# Patient Record
Sex: Female | Born: 1985 | Race: White | Hispanic: No | Marital: Single | Smoking: Current every day smoker
Health system: Southern US, Community
[De-identification: ages and names within clinical notes are randomized; demographics above are authoritative.]

## PROBLEM LIST (undated history)

## (undated) DIAGNOSIS — F1110 Opioid abuse, uncomplicated: Secondary | ICD-10-CM

## (undated) DIAGNOSIS — F1910 Other psychoactive substance abuse, uncomplicated: Secondary | ICD-10-CM

## (undated) DIAGNOSIS — J939 Pneumothorax, unspecified: Secondary | ICD-10-CM

## (undated) HISTORY — PX: OTHER SURGICAL HISTORY: SHX169

---

## 2014-12-31 ENCOUNTER — Emergency Department (HOSPITAL_COMMUNITY)
Admission: EM | Admit: 2014-12-31 | Discharge: 2014-12-31 | Disposition: A | Discharge status: Home / Self Care | Payer: Self-pay | Attending: Emergency Medicine | Admitting: Emergency Medicine

## 2014-12-31 ENCOUNTER — Encounter (HOSPITAL_COMMUNITY): Payer: Self-pay

## 2014-12-31 DIAGNOSIS — Z720 Tobacco use: Secondary | ICD-10-CM | POA: Insufficient documentation

## 2014-12-31 DIAGNOSIS — F1120 Opioid dependence, uncomplicated: Secondary | ICD-10-CM | POA: Insufficient documentation

## 2014-12-31 DIAGNOSIS — F1410 Cocaine abuse, uncomplicated: Secondary | ICD-10-CM | POA: Insufficient documentation

## 2014-12-31 HISTORY — DX: Other psychoactive substance abuse, uncomplicated: F19.10

## 2014-12-31 LAB — CBC
HEMATOCRIT: 38.5 % (ref 36.0–46.0)
Hemoglobin: 12.4 g/dL (ref 12.0–15.0)
MCH: 28.5 pg (ref 26.0–34.0)
MCHC: 32.2 g/dL (ref 30.0–36.0)
MCV: 88.5 fL (ref 78.0–100.0)
Platelets: 237 10*3/uL (ref 150–400)
RBC: 4.35 MIL/uL (ref 3.87–5.11)
RDW: 14.3 % (ref 11.5–15.5)
WBC: 4.8 10*3/uL (ref 4.0–10.5)

## 2014-12-31 LAB — COMPREHENSIVE METABOLIC PANEL
ALBUMIN: 3.5 g/dL (ref 3.5–5.0)
ALT: 68 U/L — AB (ref 14–54)
AST: 73 U/L — AB (ref 15–41)
Alkaline Phosphatase: 102 U/L (ref 38–126)
Anion gap: 7 (ref 5–15)
BILIRUBIN TOTAL: 0.7 mg/dL (ref 0.3–1.2)
BUN: 8 mg/dL (ref 6–20)
CHLORIDE: 109 mmol/L (ref 101–111)
CO2: 27 mmol/L (ref 22–32)
CREATININE: 0.74 mg/dL (ref 0.44–1.00)
Calcium: 9.1 mg/dL (ref 8.9–10.3)
GFR calc Af Amer: >60 mL/min (ref >60)
GLUCOSE: 97 mg/dL (ref 65–99)
Potassium: 4.2 mmol/L (ref 3.5–5.1)
Sodium: 143 mmol/L (ref 135–145)
Total Protein: 6.9 g/dL (ref 6.5–8.1)

## 2014-12-31 LAB — RAPID URINE DRUG SCREEN, HOSP PERFORMED
Amphetamines: NOT DETECTED
BARBITURATES: NOT DETECTED
Benzodiazepines: NOT DETECTED
COCAINE: POSITIVE — AB
Opiates: POSITIVE — AB
TETRAHYDROCANNABINOL: NOT DETECTED

## 2014-12-31 LAB — ETHANOL

## 2014-12-31 NOTE — Discharge Instructions (Signed)
Drug Abuse and Addiction in Sports °There are many types of drugs that one may become addicted to including illegal drugs (marijuana, cocaine, amphetamines, hallucinogens, and narcotics), prescription drugs (hydrocodone, codeine, and alprazolam), and other chemicals such as alcohol or nicotine. °Two types of addiction exist: physical and emotional. Physical addiction usually occurs after prolonged use of a drug. However, some drugs may only take a couple uses before addiction can occur. Physical addiction is marked by withdrawal symptoms in which the person experiences negative symptoms such as sweat, anxiety, tremors, hallucinations, or cravings in the absence of using the drug. Emotional dependence is the psychological desire for the "high" that the drugs produce when taken. °SYMPTOMS  °· Inattentiveness. °· Negligence. °· Forgetfulness. °· Insomnia. °· Mood swings. °RISK INCREASES WITH:  °· Family history of addiction. °· Personal history of addictive personality. °Studies have shown that risk takers, which many athletes are, have a higher risk of addiction. °PREVENTION °The only adequate prevention of drug abuse is abstinence from drugs. °TREATMENT  °The first step in quitting substance abuse is recognizing the problem and realizing that one has the power to change. Quitting requires a plan and support from others. It is often necessary to seek medical assistance. Caregivers are available to offer counseling, and for certain cases, medicine to diminish the physical symptoms of withdrawal. Many organizations exist such as Alcoholics Anonymous, Narcotics Anonymous, or the National Council on Alcoholism that offer support for individuals who have chosen to quit their habits. °Document Released: 04/20/2005 Document Revised: 09/04/2013 Document Reviewed: 08/02/2008 °ExitCare® Patient Information ©2015 [employer]. This information is not intended to replace advice given to you by your health care provider. Make  sure you discuss any questions you have with your health care provider. ° ° °Emergency Department Resource Guide °1) Find a Doctor and Pay Out of Pocket °Although you won't have to find out who is covered by your insurance plan, it is a good idea to ask around and get recommendations. You will then need to call the office and see if the doctor you have chosen will accept you as a new patient and what types of options they offer for patients who are self-pay. Some doctors offer discounts or will set up payment plans for their patients who do not have insurance, but you will need to ask so you aren't surprised when you get to your appointment. ° °2) Contact Your Local Health Department °Not all health departments have doctors that can see patients for sick visits, but many do, so it is worth a call to see if yours does. If you don't know where your local health department is, you can check in your phone book. The CDC also has a tool to help you locate your state's health department, and many state websites also have listings of all of their local health departments. ° °3) Find a Walk-in Clinic °If your illness is not likely to be very severe or complicated, you may want to try a walk in clinic. These are popping up all over the country in pharmacies, drugstores, and shopping centers. They're usually staffed by nurse practitioners or physician assistants that have been trained to treat common illnesses and complaints. They're usually fairly quick and inexpensive. However, if you have serious medical issues or chronic medical problems, these are probably not your best option. ° °No Primary Care Doctor: °- Call [facility] at  832-8000 - they can help you locate a primary care doctor that  accepts your insurance, provides   certain services, etc. °- Physician Referral Service- 1-800-533-3463 ° °Chronic Pain Problems: °Organization         Address  Phone   Notes  °[facility]  (336) 297-2271 Patients  need to be referred by their primary care doctor.  ° °Medication Assistance: °Organization         Address  Phone   Notes  °[place] [facility] [address] °[place], [place] [address] (336) 641-8030 --Must be a resident of [place] °-- Must have NO insurance coverage whatsoever (no Medicaid/ Medicare, etc.) °-- The pt. MUST have a primary care doctor that directs their care regularly and follows them in the community °  °[facility]  (866) 331-1348   °[employer]  (888) 892-1162   ° °Agencies that provide inexpensive medical care: °Organization         Address  Phone   Notes  °[facility]  (336) 832-8035   °[facility]    (336) 832-7272   °[facility] [address] °[place], [place] [address] (336) 832-4777   °[facility] [address], °[place] (336) 271-4999   °[facility]    (336) 373-0678   °[facility]    (336) 272-1050   °[facility] ° [address], [place] Phone:  (336) 832-4444, Fax:  (336) 832-4440 Hours of Operation:  9 am - 6 pm, M-F.  Also accepts Medicaid/Medicare and self-pay.  °[facility] ° [address], [place] Phone: (336) 832-3150, Fax: (336) 832-3151. Hours of Operation:  8:30 am - 5:30 pm, M-F.  Also accepts Medicaid and self-pay.  °[facility] [place] [address], [place] Phone: (336) 878-6027   °[facility] [address], [place], [place] (336)723-1848, Ext. 123 Mondays & Thursdays: 7-9 AM.  First 15 patients are seen on a first come, first serve basis. °  ° °Medicaid-accepting [place] Providers: ° °Organization         Address  Phone   Notes  °[facility] [address], [place] (336) 641-2100 Also accepts self-pay patients.  °[facility] [address], [place] ° (336) 856-9996     °[facility] [address], [place] (336) 288-8857   °[facility] [address] [place] [address], [place] (336) 299-7000   °Veita Bland [address], [place]  ° (336) 373-1557 Only accepts [place] Access Medicaid patients after they have their name applied to their card.  ° °Self-Pay (no insurance) in [place]: ° °Organization         Address  Phone   Notes  °Sickle Cell Patients, [facility] [address], [place] (336) 832-1970   °[facility] [address], [place] (336) 832-4400   ° [facility] ° [address] [place] [address], [place] (336) 992-4800   °[facility]/Dr. Osei-Bonsu ° [address] [place] [address], [place] or [address], [place] (336) 841-8500 Phone number for both [place] and [place] locations is the same.  °[facility] [address], [place] (336) 299-0000   °[facility] [address] [place] [address], [place] or [address] (336) 852-7530 °(336) 878-2260   °[facility] [address], [place] (336) 350-1642, phone; (336) 294-5005, fax Sees patients 1st and 3rd Saturday of every month.  Must not qualify for public or private insurance (i.e. Medicaid, Medicare, [place] Health Choice, Veterans' Benefits) • Household income should be no more than 200% of the poverty level •The clinic cannot treat you if you are pregnant or think you are pregnant • Sexually transmitted diseases are not treated at the clinic.  ° ° °Dental Care: °Organization         Address  Phone  Notes  °[facility] [address], [place] (336) 641-6152 Accepts children up to age 21 who are enrolled in Medicaid or [place] Health Choice; pregnant women with a Medicaid card; and children who have applied for Medicaid or [place] Health Choice, but were declined,  whose parents can pay a reduced fee at time of service.  °[facility] [place]  [address], [place] (336) 641-7733 Accepts children up to age 21 who are enrolled in Medicaid or [place] Health Choice; pregnant women with a Medicaid card; and children who have applied for Medicaid or [place] Health Choice, but were declined, whose parents can pay a reduced fee at time of service.  °[facility] ° [address], [place] (336) 641-4533 Patients are seen by appointment only. Walk-ins are not accepted. [facility] will see patients 18 years of age and older. °Monday - Tuesday (8am-5pm) °Most Wednesdays (8:30-5pm) °$30 per visit, cash only  °[facility] ° [address], [place] (336) 641-4533 Patients are seen by appointment only. Walk-ins are not accepted. [facility] will see patients 18 years of age and older. °One Wednesday Evening (Monthly: Volunteer Based).  $30 per visit, cash only  °[facility]  (919) 537-3737 for adults; Children under age 4, call Graduate Pediatric Dentistry at (919) [phone]. Children aged 4-14, please call (919) [phone] to request a pediatric application. ° Dental services are provided in all areas of dental care including fillings, crowns and bridges, complete and partial dentures, implants, gum treatment, root canals, and extractions. Preventive care is also provided. Treatment is provided to both adults and children. °Patients are selected via a lottery and there is often a waiting list. °  °[facility] [address], °[place] ° (336) 763-8833 [email or web address] °  °[facility] [address], [place], [place] (336)723-1848, Ext. 123 Second and Fourth Thursday of each month, opens at 6:30 AM; Clinic ends at 9 AM.  Patients are seen on a first-come first-served basis, and a limited number are seen during each clinic.  ° °[facility] ° [address], [place], [place] (336) 723-7904   Eligibility Requirements °You must have lived in [place], [place], or [place] counties for at least the last three months. °  You cannot be eligible for state or federal sponsored healthcare insurance, including Veterans Administration, Medicaid, or Medicare. °  You generally cannot be eligible for healthcare insurance through your employer.  °  How to apply: °Eligibility screenings are held every Tuesday and Wednesday afternoon from 1:00 pm until 4:00 pm. You do not need an appointment for the interview!  °[facility] [address], [place], [place] 336-631-2330   °[facility]  336-342-8273   °[facility]  336-703-3100   °[facility]  336-570-6415   ° °  Behavioral Health Resources in the Community: °Intensive Outpatient Programs °Organization         Address  Phone  Notes  °[facility] [address], [place], [place] 336-878-6098   °[facility] [address], [place], [place] 336-832-9800   °[facility] [address], [place], [place] ° 336-882-2125   °[facility] [address],  °[place], [place] 1-800-853-5163 or 336-641-4981   °Substance Abuse Resources °Organization         Address  Phone  Notes  °[facility]  336-882-2125   °[facility]  336-784-9470   °[facility]  336-285-9073   °[facility]  336-845-3988   °Residential & Outpatient Substance Abuse Program  1-800-659-3381   °Psychological Services °Organization         Address  Phone  Notes  °[facility]  336- 832-9600   °[facility]  336- 378-7881   °[facility] [address], [place] 1-800-853-5163 or 336-641-4981   ° °Mobile Crisis Teams °Organization         Address  Phone  Notes  °[facility], Mobile Crisis Care Unit   1-877-626-1772   °Assertive °[facility] ° [address] [place], [place] 336-834-9664   °Sharon DeEsch [address] °[place] 336-554-5454   ° °Self-Help/Support Groups °Organization         Address  Phone             Notes  °[facility] [place] - variety of support groups  336- 373-1402 Call for more information  °Narcotics Anonymous (NA), [facility] [address], °[place]  2 meetings at this location  ° °Residential Treatment Programs °Organization         Address  Phone  Notes  °[facility] [address],    °[place]  1-866-801-8205   °[facility] ° [address], [place], [place] 704-293-8524   °[facility] [address], [place] 336-845-3988 Admissions: 8am-3pm M-F  °[facility] [address]    °[place], [place] 336-841-1104   °[facility] [address], [place], [place] 336-379-7146   °[facility] [address]  °[place], [place] 336-285-9073   °[facility] - Intensive Outpatient [address], [place], [place] 336-852-3033   °[facility] [address]  °[place], [place] 1-877-615-2722 or 336-784-9470   °[facility] [address] [place], [place] 336-227-7417 Accepts Medicaid  °[facility] [address]  °[place] 1-800-659-3381 Substance Abuse/Addiction Treatment  ° °[place] Behavioral Health Resources °Organization         Address  Phone  Notes  °[facility]  (888) 581-9988   °Julie Brannon, PhD [address] [place], [place]   (336) 349-5553 or (336) 951-0000   ° Behavioral   [address] °[place], [place] (336) 349-4454   °[facility] [address], [place], [place] (336) 342-8316 Insurance/Medicaid/sponsorship through [facility]  °[facility] [address]                                     [place], [place] (336) 342-8316 Therapy/tele-psych/case  °[facility] [address]  °   [place], [place] (336) 349-2233    °Dr. Arfeen  (336) 349-4544   °[facility] [place]  [employer] [place] [facility] 1) [address], [place] °2) [address], [place] °3)  [address], [place] (336) 349-3220 °(336) 342-7768 ° °(336) 342-8140   °[place] [facility] (336) 342-1394 or (336) 342-3537 (After Hours)    ° ° ° °

## 2014-12-31 NOTE — ED Notes (Signed)
Patient states she was told to come to the ED to be medically cleared so she could be admitted in a substance  Abuse program. Patient denies any SI/HI, visual or auditory hallucinations.

## 2014-12-31 NOTE — ED Provider Notes (Signed)
CSN: [id number]     Arrival date & time 12/31/14  1737 History   First MD Initiated Contact with Patient 12/31/14 1959     Chief Complaint  Patient presents with  . Medical Clearance  . Addiction Problem    heroin and cocaine     (Consider location/radiation/quality/duration/timing/severity/associated sxs/prior Treatment) The history is provided by the patient. No language interpreter was used.  Ms. Cheyenne Johns is a 29 y/o female with a history of substance abuse who presents for admission into outpatient substance abuse program ([facility]). She states that they would not admit her because she admitted to them that she was suicidal when coming off of heroin but that this happens to her often. She denies any SI or any plan now. She states that the program requested that she be medically cleared and that a form is faxed to the program stating that she has not longer suicidal. She denies any HI, hallucinations, alcohol use, benzodiazepine use. She does admit to using cocaine with heroin on occasion. She denies any complaints of pain now.  Past Medical History  Diagnosis Date  . Substance abuse    Past Surgical History  Procedure Laterality Date  . Nerve replacement     Family History  Problem Relation Age of Onset  . Cancer Mother   . Pneumonia Mother   . Seizures Father   . Alcohol abuse Father    Social History  Substance Use Topics  . Smoking status: Current Every Day Smoker -- 1.00 packs/day    Types: Cigarettes  . Smokeless tobacco: Never Used  . Alcohol Use: Yes     Comment: occasionaloly   OB History    No data available     Review of Systems  Respiratory: Negative for shortness of breath.   Cardiovascular: Negative for chest pain.  Psychiatric/Behavioral: Negative for suicidal ideas, hallucinations and agitation.  All other systems reviewed and are negative.     Allergies  Review of patient's allergies indicates no known allergies.  Home Medications   Prior to  Admission medications   Medication Sig Start Date End Date Taking? Authorizing Provider  ibuprofen (ADVIL,MOTRIN) 200 MG tablet Take 1,000 mg by mouth once as needed for headache.   Yes Historical Provider, MD   BP 112/64 mmHg  Pulse 74  Temp(Src) 97.6 F (36.4 C) (Oral)  Resp 18  SpO2 99%  LMP 09/30/2014 Physical Exam  Constitutional: She is oriented to person, place, and time. She appears well-developed and well-nourished.  HENT:  Head: Normocephalic and atraumatic.  Eyes: Conjunctivae are normal.  Neck: Normal range of motion. Neck supple.  Cardiovascular: Normal rate, regular rhythm and normal heart sounds.   Pulmonary/Chest: Effort normal and breath sounds normal.  Abdominal: Soft. There is no tenderness.  Musculoskeletal: Normal range of motion.  Neurological: She is alert and oriented to person, place, and time.  Skin: Skin is warm and dry.  Psychiatric: She has a normal mood and affect. Her behavior is normal. Her mood appears not anxious. Her speech is not rapid and/or pressured. She expresses no homicidal and no suicidal ideation. She expresses no suicidal plans and no homicidal plans.  Nursing note and vitals reviewed.   ED Course  Procedures (including critical care time) Labs Review Labs Reviewed  COMPREHENSIVE METABOLIC PANEL - Abnormal; Notable for the following:    AST 73 (*)    ALT 68 (*)    All other components within normal limits  URINE RAPID DRUG SCREEN, HOSP PERFORMED - Abnormal;  Notable for the following:    Opiates POSITIVE (*)    Cocaine POSITIVE (*)    All other components within normal limits  ETHANOL  CBC    Imaging Review No results found. I have personally reviewed and evaluated these images and lab results as part of my medical decision-making.   EKG Interpretation None      MDM   Final diagnoses:  Heroin dependence   Patient verbalizes no SI, HI, hallucinations, benzodiazepine or alcohol use. She states she just needs medical  clearance to be admitted into an outpatient heroin detox program. She is well-appearing and in no acute distress. I do not believe she is a harm to herself or others. Social work has seen the patient and agrees with the plan. They called the outpatient clinic, [facility], and stated that they would have a bed for her tomorrow. Her labs are unremarkable and I believe she is safe for discharge.     Catha Gosselin, PA-C 01/01/15 0012  Azalia Bilis, MD 01/01/15 [phone]

## 2014-12-31 NOTE — Progress Notes (Signed)
CSW was consulted by PA to speak with patient.  CSW met with patient at bedside. Patient denies SI, HI and AVH. Patient informed CSW that she would like to go to [facility] for rehab from heroin. Patient informed CSW that she feels safe to return home. Patient states that she plans to check into [facility] in the morning.  CSW reached out to [facility] and spoke with Nurse Raquel Sarna, who states she needs progress notes on the patient and her mental status. Nurse states that they do have bed availability at this time.  CSW will fax [facility] Nurse the requested information.  [facility] Fax [phone]  Mana/Patient [phone] Cheyenne Johns [phone] ED CSW 12/31/2014 9:06 PM

## 2014-12-31 NOTE — ED Notes (Signed)
Called pt x2, no answer from the lobby.  

## 2016-01-23 ENCOUNTER — Encounter (HOSPITAL_COMMUNITY): Payer: Self-pay | Admitting: Emergency Medicine

## 2016-01-23 ENCOUNTER — Emergency Department (HOSPITAL_COMMUNITY)
Admission: EM | Admit: 2016-01-23 | Discharge: 2016-01-23 | Disposition: A | Discharge status: Home / Self Care | Payer: Self-pay | Attending: Emergency Medicine | Admitting: Emergency Medicine

## 2016-01-23 ENCOUNTER — Emergency Department (HOSPITAL_COMMUNITY): Payer: Self-pay

## 2016-01-23 DIAGNOSIS — J939 Pneumothorax, unspecified: Secondary | ICD-10-CM | POA: Insufficient documentation

## 2016-01-23 DIAGNOSIS — Y929 Unspecified place or not applicable: Secondary | ICD-10-CM | POA: Insufficient documentation

## 2016-01-23 DIAGNOSIS — Y939 Activity, unspecified: Secondary | ICD-10-CM | POA: Insufficient documentation

## 2016-01-23 DIAGNOSIS — Y999 Unspecified external cause status: Secondary | ICD-10-CM | POA: Insufficient documentation

## 2016-01-23 DIAGNOSIS — F17210 Nicotine dependence, cigarettes, uncomplicated: Secondary | ICD-10-CM | POA: Insufficient documentation

## 2016-01-23 HISTORY — DX: Opioid abuse, uncomplicated: F11.10

## 2016-01-23 MED ORDER — KETOROLAC TROMETHAMINE 30 MG/ML IJ SOLN
30.0000 mg | Freq: Once | INTRAMUSCULAR | Status: AC
  Administered 2016-01-23: 30 mg via INTRAMUSCULAR
  Filled 2016-01-23: qty 1

## 2016-01-23 MED ORDER — OXYCODONE-ACETAMINOPHEN 5-325 MG PO TABS
1.0000 | ORAL_TABLET | Freq: Once | ORAL | Status: AC
  Administered 2016-01-23: 1 via ORAL
  Filled 2016-01-23: qty 1

## 2016-01-23 NOTE — Discharge Instructions (Signed)
Please return to the emergency room tomorrow afternoon for repeat chest x-ray. If you experience any new or worsening signs or symptoms in the meantime please return immediately for further evaluation. Use incentive spirometer as directed, Tylenol or ibuprofen as needed for discomfort.

## 2016-01-23 NOTE — ED Triage Notes (Signed)
Pt not answering when her name is called to be triage
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
## 2016-01-24 ENCOUNTER — Emergency Department (HOSPITAL_COMMUNITY): Payer: Self-pay

## 2016-01-24 ENCOUNTER — Encounter (HOSPITAL_COMMUNITY): Payer: Self-pay | Admitting: Emergency Medicine

## 2016-01-24 ENCOUNTER — Emergency Department (HOSPITAL_COMMUNITY)
Admission: EM | Admit: 2016-01-24 | Discharge: 2016-01-24 | Disposition: A | Discharge status: Home / Self Care | Payer: Self-pay | Attending: Emergency Medicine | Admitting: Emergency Medicine

## 2016-01-24 DIAGNOSIS — F17210 Nicotine dependence, cigarettes, uncomplicated: Secondary | ICD-10-CM | POA: Insufficient documentation

## 2016-01-24 DIAGNOSIS — S270XXD Traumatic pneumothorax, subsequent encounter: Secondary | ICD-10-CM | POA: Insufficient documentation

## 2016-01-24 HISTORY — DX: Pneumothorax, unspecified: J93.9

## 2016-01-24 NOTE — ED Triage Notes (Signed)
Pt seen here yesterday dx with pneumothorax. Pt told to return to ED today for repeat CXR and UA. Pt denies worsening symptoms. VSS.
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
## 2016-05-20 ENCOUNTER — Emergency Department (HOSPITAL_COMMUNITY)
Admission: EM | Admit: 2016-05-20 | Discharge: 2016-05-20 | Disposition: A | Discharge status: Home / Self Care | Payer: Self-pay | Attending: Emergency Medicine | Admitting: Emergency Medicine

## 2016-05-20 ENCOUNTER — Encounter (HOSPITAL_COMMUNITY): Payer: Self-pay | Admitting: *Deleted

## 2016-05-20 DIAGNOSIS — F17210 Nicotine dependence, cigarettes, uncomplicated: Secondary | ICD-10-CM | POA: Insufficient documentation

## 2016-05-20 DIAGNOSIS — L0201 Cutaneous abscess of face: Secondary | ICD-10-CM | POA: Insufficient documentation

## 2016-05-20 DIAGNOSIS — L0291 Cutaneous abscess, unspecified: Secondary | ICD-10-CM

## 2016-05-20 DIAGNOSIS — L02212 Cutaneous abscess of back [any part, except buttock]: Secondary | ICD-10-CM | POA: Insufficient documentation

## 2016-05-20 MED ORDER — DOXYCYCLINE HYCLATE 100 MG PO CAPS: 100.0000 mg | ORAL_CAPSULE | Freq: Two times a day (BID) | ORAL | 0 refills | Status: AC

## 2016-05-20 MED ORDER — LIDOCAINE HCL (PF) 1 % IJ SOLN
5.0000 mL | Freq: Once | INTRAMUSCULAR | Status: AC
  Administered 2016-05-20: 5 mL
  Filled 2016-05-20: qty 5

## 2016-05-20 NOTE — ED Triage Notes (Signed)
Pt with abscess to R jaw and back x 4 days.

## 2016-05-20 NOTE — Discharge Instructions (Signed)
Please follow up with your doctor in 3-4 days for a wound recheck. Please take all of your antibiotics as prescribed, do not save or share them. Continue taking Tylenol and Motrin for discomfort. Return sooner for any new or worsening symptoms as we discussed, including, but not limited to fevers, chills or worsening redness or pain.

## 2016-05-20 NOTE — ED Provider Notes (Signed)
[facility] Provider Note   CSN: [id number] Arrival date & time: 05/20/16  1042  By signing my name below, I, Majel HomerPeyton Lee, attest that this documentation has been prepared under the direction and in the presence of LandAmerica FinancialBen Oneal Schoenberger, PA-C . Electronically Signed: Majel HomerPeyton Lee, Scribe. 05/20/2016. 11:26 AM.  History   Chief Complaint Chief Complaint  Patient presents with  . Abscess   The history is provided by the patient. No language interpreter was used.   HPI Comments: Cheyenne Johns is a 31 y.o. female with PMHx of heroin and substance abuse, who presents to the Emergency Department complaining of gradually improving, "sore" to her right jaw that appeared ~4 days ago. Pt reports she initially believed it was a "pimple," but notes she applied a warm compress and it immediately began draining. She states it is now "hard," but her pain has substantially improved. Pt also reports a gradually worsening, "sore" to her upper back that appeared ~4 days ago. She notes she has applied a warm compress to her back as well with no active drainage. She states her pain is worse on her back compared to her face as "she cannot reach the area as well."  Pt notes she has not taken any medication to relieve her pain. She denies dental pain, fever, and chills.    Past Medical History:  Diagnosis Date  . Heroin abuse   . Pneumothorax   . Substance abuse    There are no active problems to display for this patient.  Past Surgical History:  Procedure Laterality Date  . nerve replacement      OB History    No data available     Home Medications    Prior to Admission medications   Medication Sig Start Date End Date Taking? Authorizing Provider  doxycycline (VIBRAMYCIN) 100 MG capsule Take 1 capsule (100 mg total) by mouth 2 (two) times daily. One po bid x 7 days 05/20/16   Joycie PeekBenjamin Tiwan Schnitker, PA-C  METHADONE HCL PO Take 1 tablet by mouth daily. Patient sates its a 46mg  tablet    Historical Provider, MD     Family History Family History  Problem Relation Age of Onset  . Cancer Mother   . Pneumonia Mother   . Seizures Father   . Alcohol abuse Father     Social History Social History  Substance Use Topics  . Smoking status: Current Every Day Smoker    Packs/day: 0.50    Types: Cigarettes  . Smokeless tobacco: Never Used  . Alcohol use No     Allergies   Patient has no known allergies.  Review of Systems Review of Systems  Constitutional: Negative for chills and fever.  HENT: Negative for dental problem.   Skin:       +abscess to right jaw and thoracic back    Physical Exam Updated Vital Signs BP 129/82 (BP Location: Right Arm)   Pulse 74   Temp 98.5 F (36.9 C) (Oral)   Resp 16   Ht 5\' 7"  (1.702 m)   Wt 180 lb (81.6 kg)   LMP 04/09/2016   SpO2 99%   BMI 28.19 kg/m   Physical Exam  Constitutional: She is oriented to person, place, and time. She appears well-developed and well-nourished.  HENT:  Head: Normocephalic.  Eyes: EOM are normal.  Neck: Normal range of motion.  Pulmonary/Chest: Effort normal.  Abdominal: She exhibits no distension.  Musculoskeletal: Normal range of motion.  Neurological: She is alert and oriented to  person, place, and time.  Skin:  Spontaneously draining abscess just below angle of right mandible ~1.5 cm in diameter with mild induration and without cellulitis.  There is another abscess to left mid thoracic back-below scapula, roughly 1.5 cm in diameter with mild surrounding cellulitis.   Psychiatric: She has a normal mood and affect.  Nursing note and vitals reviewed.  ED Treatments / Results  Labs (all labs ordered are listed, but only abnormal results are displayed) Labs Reviewed - No data to display  EKG  EKG Interpretation None       Radiology No results found.  Procedures Procedures (including critical care time) INCISION AND DRAINAGE PROCEDURE NOTE: Patient identification was confirmed and verbal consent was  obtained. This procedure was performed by Mayme Genta at 11:26 AM. Site: Left thoracic back  Sterile procedures observed Needle size: 27 Anesthetic used (type and amt): 1% Lido  Blade size: 11 Drainage: Mild purulent discharge  Complexity: Complex Site anesthetized, incision made over site, wound drained and explored loculations, rinsed with copious amounts of normal saline, wound packed with sterile gauze, covered with dry, sterile dressing.  Pt tolerated procedure well without complications.  Instructions for care discussed verbally and pt provided with additional written instructions for homecare and f/u.  Medications Ordered in ED Medications  lidocaine (PF) (XYLOCAINE) 1 % injection 5 mL (5 mLs Infiltration Given by Other 05/20/16 1121)    DIAGNOSTIC STUDIES:  Oxygen Saturation is 99% on RA, normal by my interpretation.    COORDINATION OF CARE:  11:13 AM Discussed treatment plan with pt at bedside and pt agreed to plan.  Initial Impression / Assessment and Plan / ED Course  I have reviewed the triage vital signs and the nursing notes.  Pertinent labs & imaging results that were available during my care of the patient were reviewed by me and considered in my medical decision making (see chart for details).  Clinical Course     Patient with skin abscess amenable to incision and drainage.  Abscess was not large enough to warrant packing or drain,  wound recheck in 3 days. Encouraged home warm soaks and flushing.  Mild signs of cellulitis in surrounding skin, Started on doxycycline.  Will d/c to home.   I personally performed the services described in this documentation, which was scribed in my presence. The recorded information has been reviewed and is accurate.   Final Clinical Impressions(s) / ED Diagnoses   Final diagnoses:  Abscess    New Prescriptions Discharge Medication List as of 05/20/2016 11:47 AM    START taking these medications   Details  doxycycline  (VIBRAMYCIN) 100 MG capsule Take 1 capsule (100 mg total) by mouth 2 (two) times daily. One po bid x 7 days, Starting Wed 05/20/2016, Print         Joycie Peek, PA-C 05/20/16 1222    Laurence Spates, MD 05/28/16 [phone]

## 2018-01-09 IMAGING — DX DG CHEST 2V
2 series · 2 of 2 positions shown · non-contrast
Comparison: 01/23/2016

CLINICAL DATA: Followup pneumothorax

EXAM:
CHEST  2 VIEW

[w chest pa]
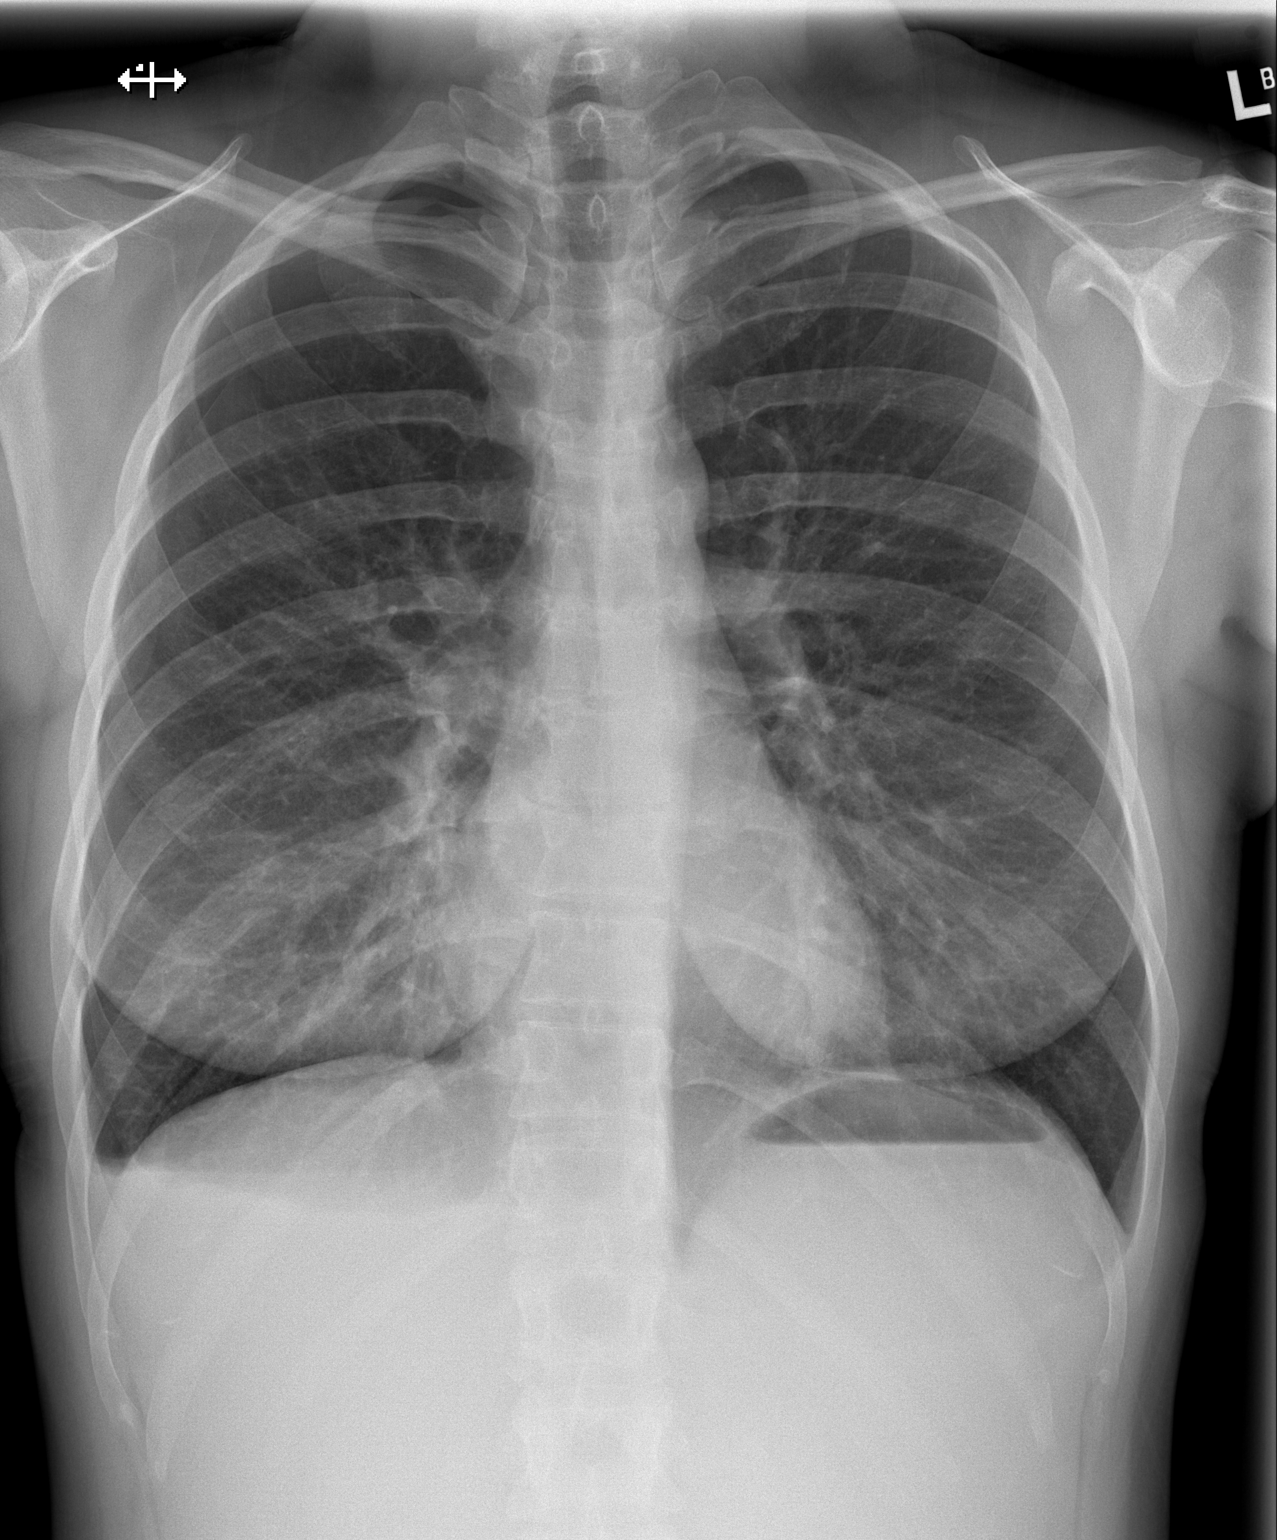

[w chest lat]
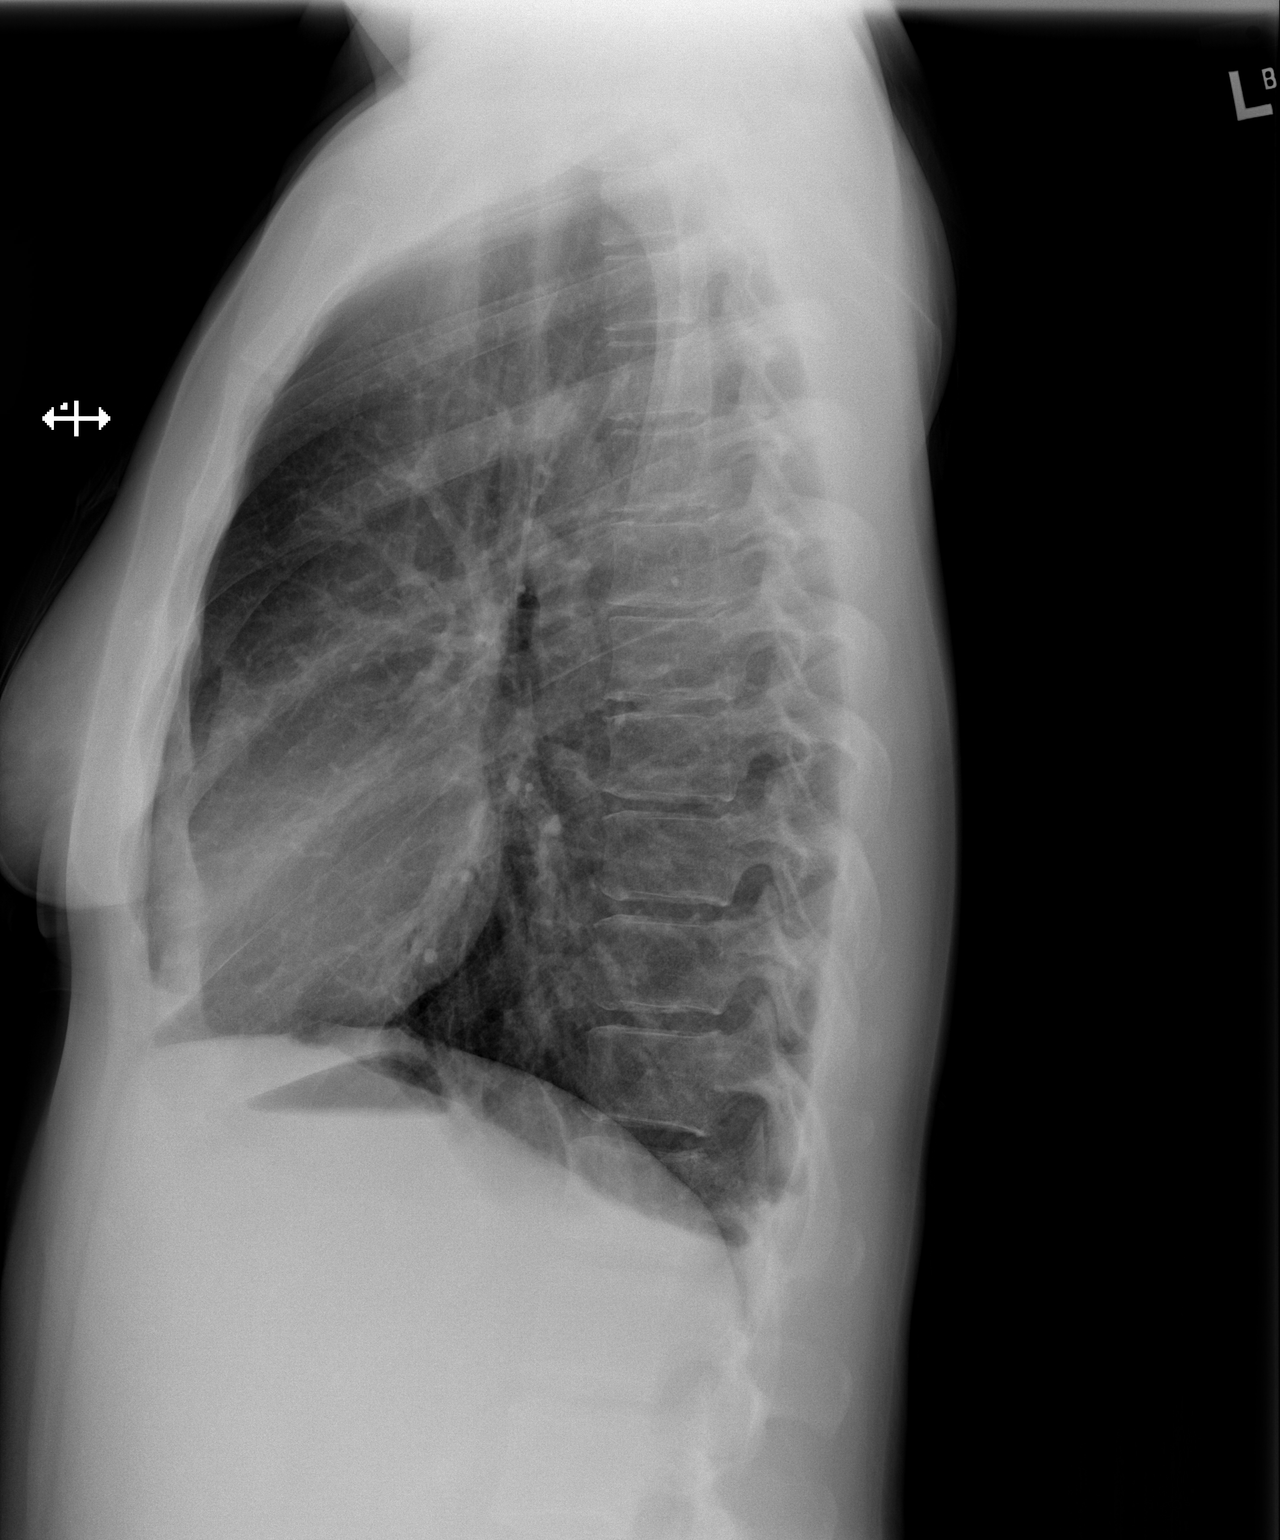

[2 of 2 positions shown; findings below may reference images not displayed]

FINDINGS: No change right hydro pneumothorax involving the apex and the base
laterally estimated 20%. Small right effusion. Mild right lower lobe
atelectasis unchanged.

Left lung remains clear.

Heart size and vascularity normal. No mass lesion. No obvious blebs
identified.
IMPRESSION: Moderate right hydro pneumothorax unchanged from yesterday.

## 2024-05-13 ENCOUNTER — Emergency Department (HOSPITAL_COMMUNITY): Payer: Self-pay

## 2024-05-13 ENCOUNTER — Emergency Department (HOSPITAL_COMMUNITY)
Admission: EM | Admit: 2024-05-13 | Discharge: 2024-05-13 | Disposition: A | Discharge status: Home / Self Care | Payer: Self-pay | Attending: Emergency Medicine | Admitting: Emergency Medicine

## 2024-05-13 DIAGNOSIS — S51812A Laceration without foreign body of left forearm, initial encounter: Secondary | ICD-10-CM | POA: Diagnosis present

## 2024-05-13 DIAGNOSIS — S3011XA Contusion of abdominal wall, initial encounter: Secondary | ICD-10-CM | POA: Diagnosis not present

## 2024-05-13 DIAGNOSIS — Z5321 Procedure and treatment not carried out due to patient leaving prior to being seen by health care provider: Secondary | ICD-10-CM | POA: Diagnosis not present

## 2024-05-13 DIAGNOSIS — Y92410 Unspecified street and highway as the place of occurrence of the external cause: Secondary | ICD-10-CM | POA: Insufficient documentation

## 2024-05-13 DIAGNOSIS — M25522 Pain in left elbow: Secondary | ICD-10-CM | POA: Insufficient documentation
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
## 2024-05-13 NOTE — ED Notes (Signed)
 Pt refused IV access and blood work. Triage nurse notified
